# Patient Record
Sex: Female | Born: 1985 | Hispanic: Yes | Marital: Married | Smoking: Never smoker
Health system: Southern US, Community
[De-identification: ages and names within clinical notes are randomized; demographics above are authoritative.]

---

## 2020-02-12 ENCOUNTER — Emergency Department: Payer: Self-pay

## 2020-02-12 ENCOUNTER — Other Ambulatory Visit: Payer: Self-pay

## 2020-02-12 ENCOUNTER — Emergency Department
Admission: EM | Admit: 2020-02-12 | Discharge: 2020-02-12 | Disposition: A | Discharge status: Home / Self Care | Payer: Self-pay | Attending: Emergency Medicine | Admitting: Emergency Medicine

## 2020-02-12 DIAGNOSIS — R109 Unspecified abdominal pain: Secondary | ICD-10-CM | POA: Insufficient documentation

## 2020-02-12 DIAGNOSIS — M25552 Pain in left hip: Secondary | ICD-10-CM | POA: Insufficient documentation

## 2020-02-12 DIAGNOSIS — S40212A Abrasion of left shoulder, initial encounter: Secondary | ICD-10-CM | POA: Insufficient documentation

## 2020-02-12 DIAGNOSIS — Y92410 Unspecified street and highway as the place of occurrence of the external cause: Secondary | ICD-10-CM | POA: Insufficient documentation

## 2020-02-12 DIAGNOSIS — M545 Low back pain, unspecified: Secondary | ICD-10-CM | POA: Insufficient documentation

## 2020-02-12 DIAGNOSIS — S060X1A Concussion with loss of consciousness of 30 minutes or less, initial encounter: Secondary | ICD-10-CM | POA: Insufficient documentation

## 2020-02-12 LAB — URINALYSIS, COMPLETE (UACMP) WITH MICROSCOPIC
Bilirubin Urine: NEGATIVE
Glucose, UA: NEGATIVE mg/dL
Ketones, ur: NEGATIVE mg/dL
Leukocytes,Ua: NEGATIVE
Nitrite: NEGATIVE
Protein, ur: NEGATIVE mg/dL
Specific Gravity, Urine: 1.005 (ref 1.005–1.030)
pH: 5 (ref 5.0–8.0)

## 2020-02-12 LAB — CBC
HCT: 36.6 % (ref 36.0–46.0)
Hemoglobin: 12.1 g/dL (ref 12.0–15.0)
MCH: 30.3 pg (ref 26.0–34.0)
MCHC: 33.1 g/dL (ref 30.0–36.0)
MCV: 91.5 fL (ref 80.0–100.0)
Platelets: 257 10*3/uL (ref 150–400)
RBC: 4 MIL/uL (ref 3.87–5.11)
RDW: 12.9 % (ref 11.5–15.5)
WBC: 7.9 10*3/uL (ref 4.0–10.5)
nRBC: 0 % (ref 0.0–0.2)

## 2020-02-12 LAB — POC URINE PREG, ED: Preg Test, Ur: NEGATIVE

## 2020-02-12 LAB — BASIC METABOLIC PANEL
Anion gap: 8 (ref 5–15)
BUN: 9 mg/dL (ref 6–20)
CO2: 27 mmol/L (ref 22–32)
Calcium: 9 mg/dL (ref 8.9–10.3)
Chloride: 105 mmol/L (ref 98–111)
Creatinine, Ser: 0.59 mg/dL (ref 0.44–1.00)
GFR, Estimated: >60 mL/min (ref >60)
Glucose, Bld: 104 mg/dL — ABNORMAL HIGH (ref 70–99)
Potassium: 4 mmol/L (ref 3.5–5.1)
Sodium: 140 mmol/L (ref 135–145)

## 2020-02-12 LAB — PREGNANCY, URINE: Preg Test, Ur: NEGATIVE

## 2020-02-12 MED ORDER — IBUPROFEN 600 MG PO TABS: 600.0000 mg | ORAL_TABLET | Freq: Four times a day (QID) | ORAL | 0 refills | Status: DC | PRN

## 2020-02-12 MED ORDER — IOHEXOL 300 MG/ML  SOLN
100.0000 mL | Freq: Once | INTRAMUSCULAR | Status: AC | PRN
  Administered 2020-02-12: 100 mL via INTRAVENOUS
  Filled 2020-02-12: qty 100

## 2020-02-12 MED ORDER — CYCLOBENZAPRINE HCL 5 MG PO TABS: ORAL_TABLET | ORAL | 0 refills | Status: DC
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
## 2020-02-12 NOTE — ED Notes (Addendum)
Pt reports slight pain with ambulation to left hip/leg. Steady on feet  interpreter used for triage:   Benny: [id number]

## 2020-02-12 NOTE — ED Notes (Signed)
Pt calm , collective, and ambulatory upon discharge

## 2020-07-18 ENCOUNTER — Ambulatory Visit (LOCAL_COMMUNITY_HEALTH_CENTER): Payer: Self-pay | Admitting: Family Medicine

## 2020-07-18 ENCOUNTER — Other Ambulatory Visit: Payer: Self-pay

## 2020-07-18 ENCOUNTER — Encounter: Payer: Self-pay | Admitting: Family Medicine

## 2020-07-18 DIAGNOSIS — Z3009 Encounter for other general counseling and advice on contraception: Secondary | ICD-10-CM

## 2020-07-18 DIAGNOSIS — Z0000 Encounter for general adult medical examination without abnormal findings: Secondary | ICD-10-CM

## 2020-07-18 DIAGNOSIS — Z113 Encounter for screening for infections with a predominantly sexual mode of transmission: Secondary | ICD-10-CM

## 2020-07-18 LAB — WET PREP FOR TRICH, YEAST, CLUE
Trichomonas Exam: NEGATIVE
Yeast Exam: NEGATIVE

## 2020-07-18 LAB — HM HIV SCREENING LAB: HM HIV Screening: NEGATIVE

## 2020-07-18 NOTE — Progress Notes (Signed)
Pt here for PE and possible IUD removal. Wet mount results reviewed with Provider.  No treatment required.

## 2020-07-18 NOTE — Progress Notes (Incomplete)
Family Planning Visit- Repeat Yearly Visit  Subjective:  Andrea Russo is a 35 y.o. [phone]  being seen today for an annual wellness visit and to discuss contraception options.   The patient is currently using IUD Mirena for pregnancy prevention. Patient does not want a pregnancy in the next year. Patient has the following medical problems: does not have a problem list on file.  No chief complaint on file.   Patient reports here for physical   Patient denies any problems or concerns.    See flowsheet for other program required questions.   There is no height or weight on file to calculate BMI. - Patient is eligible for diabetes screening based on BMI and age >63?  not applicable HA1C ordered? not applicable  Patient reports 1 of partners in last year. Desires STI screening?  Yes   Has patient been screened once for HCV in the past?  No  No results found for: HCVAB  Does the patient have current of drug use, have a partner with drug use, and/or has been incarcerated since last result? No  If yes-- Screen for HCV through [facility]   Does the patient meet criteria for HBV testing? No  Criteria:  -Household, sexual or needle sharing contact with HBV -History of drug use -HIV positive -Those with known Hep C   Health Maintenance Due  Topic Date Due  . HIV Screening  Never done  . Hepatitis C Screening  Never done  . TETANUS/TDAP  Never done  . PAP SMEAR-Modifier  Never done    Review of Systems  Constitutional: Negative for chills, fever, malaise/fatigue and weight loss.  HENT: Negative for congestion, hearing loss and sore throat.   Eyes: Negative for blurred vision, double vision and photophobia.  Respiratory: Negative for shortness of breath.   Cardiovascular: Negative for chest pain.  Gastrointestinal: Negative for abdominal pain, blood in stool, constipation, diarrhea, heartburn, nausea and vomiting.  Genitourinary: Negative for dysuria and frequency.   Musculoskeletal: Negative for back pain, joint pain and neck pain.  Skin: Negative for itching and rash.  Neurological: Negative for dizziness, weakness and headaches.  Endo/Heme/Allergies: Does not bruise/bleed easily.  Psychiatric/Behavioral: Negative for depression, substance abuse and suicidal ideas.    The following portions of the patient's history were reviewed and updated as appropriate: allergies, current medications, past family history, past medical history, past social history, past surgical history and problem list. Problem list updated.  Objective:  There were no vitals filed for this visit.  Physical Exam Vitals and nursing note reviewed.  Constitutional:      Appearance: Normal appearance.  HENT:     Head: Normocephalic and atraumatic.     Mouth/Throat:     Mouth: Mucous membranes are moist.     Dentition: Normal dentition.     Pharynx: No oropharyngeal exudate or posterior oropharyngeal erythema.  Eyes:     General: No scleral icterus. Neck:     Thyroid: No thyroid mass, thyromegaly or thyroid tenderness.  Cardiovascular:     Rate and Rhythm: Normal rate.     Pulses: Normal pulses.  Pulmonary:     Effort: Pulmonary effort is normal.  Chest:     Comments: Breasts:        Right: Normal. No swelling, mass, nipple discharge, skin change or tenderness.        Left: Normal. No swelling, mass, nipple discharge, skin change or tenderness.  Abdominal:     General: Abdomen is flat. Bowel  sounds are normal.     Palpations: Abdomen is soft.  Musculoskeletal:        General: Normal range of motion.  Skin:    General: Skin is warm and dry.  Neurological:     General: No focal deficit present.     Mental Status: She is alert.       Assessment and Plan:  Andrea Russo is a 35 y.o. female [phone] presenting to the [facility] Department for an yearly wellness and contraception visit   2. Screening examination for venereal disease  -  Chlamydia/Gonorrhea [facility] - Syphilis Serology, [facility] - HIV [place] [facility] - WET PREP FOR TRICH, YEAST, CLUE  2. Routine general medical examination at a health care facility  Woman exam exam today  Pap due 2024     3. Family planning counseling  Contraception counseling: Reviewed all forms of birth control options in the tiered based approach. available including abstinence; over the counter/barrier methods; hormonal contraceptive medication including pill, patch, ring, injection,contraceptive implant, ECP; hormonal and nonhormonal IUDs; permanent sterilization options including vasectomy and the various tubal sterilization modalities. Risks, benefits, and typical effectiveness rates were reviewed.  Questions were answered.  Written information was also given to the patient to review.  Patient Andrea Russo does not expire until 02/2021.  Patient to RTC 02/2021 to remove and replace BCM.    Patient was not offered ECP based on current IUD.    No follow-ups on file.  No future appointments.  Wendi Snipes, FNP

## 2020-07-21 NOTE — Progress Notes (Signed)
Family Planning Visit- Repeat Yearly Visit  Subjective:  Andrea Russo is a 35 y.o. [phone]  being seen today for an annual wellness visit and to discuss contraception options.   The patient is currently using IUD Mirena for pregnancy prevention. Patient does not want a pregnancy in the next year. Patient has the following medical problems: does not have a problem list on file.  No chief complaint on file.   Patient reports here for physical   Patient denies any problems or concerns.    See flowsheet for other program required questions.   There is no height or weight on file to calculate BMI. - Patient is eligible for diabetes screening based on BMI and age >63?  not applicable HA1C ordered? not applicable  Patient reports 1 of partners in last year. Desires STI screening?  Yes   Has patient been screened once for HCV in the past?  No  No results found for: HCVAB  Does the patient have current of drug use, have a partner with drug use, and/or has been incarcerated since last result? No  If yes-- Screen for HCV through [facility]   Does the patient meet criteria for HBV testing? No  Criteria:  -Household, sexual or needle sharing contact with HBV -History of drug use -HIV positive -Those with known Hep C   Health Maintenance Due  Topic Date Due  . HIV Screening  Never done  . Hepatitis C Screening  Never done  . TETANUS/TDAP  Never done  . PAP SMEAR-Modifier  Never done    Review of Systems  Constitutional: Negative for chills, fever, malaise/fatigue and weight loss.  HENT: Negative for congestion, hearing loss and sore throat.   Eyes: Negative for blurred vision, double vision and photophobia.  Respiratory: Negative for shortness of breath.   Cardiovascular: Negative for chest pain.  Gastrointestinal: Negative for abdominal pain, blood in stool, constipation, diarrhea, heartburn, nausea and vomiting.  Genitourinary: Negative for dysuria and frequency.   Musculoskeletal: Negative for back pain, joint pain and neck pain.  Skin: Negative for itching and rash.  Neurological: Negative for dizziness, weakness and headaches.  Endo/Heme/Allergies: Does not bruise/bleed easily.  Psychiatric/Behavioral: Negative for depression, substance abuse and suicidal ideas.    The following portions of the patient's history were reviewed and updated as appropriate: allergies, current medications, past family history, past medical history, past social history, past surgical history and problem list. Problem list updated.  Objective:  There were no vitals filed for this visit.  Physical Exam Vitals and nursing note reviewed.  Constitutional:      Appearance: Normal appearance.  HENT:     Head: Normocephalic and atraumatic.     Mouth/Throat:     Mouth: Mucous membranes are moist.     Dentition: Normal dentition.     Pharynx: No oropharyngeal exudate or posterior oropharyngeal erythema.  Eyes:     General: No scleral icterus. Neck:     Thyroid: No thyroid mass, thyromegaly or thyroid tenderness.  Cardiovascular:     Rate and Rhythm: Normal rate.     Pulses: Normal pulses.  Pulmonary:     Effort: Pulmonary effort is normal.  Chest:     Comments: Breasts:        Right: Normal. No swelling, mass, nipple discharge, skin change or tenderness.        Left: Normal. No swelling, mass, nipple discharge, skin change or tenderness.  Abdominal:     General: Abdomen is flat. Bowel  sounds are normal.     Palpations: Abdomen is soft.  Musculoskeletal:        General: Normal range of motion.  Skin:    General: Skin is warm and dry.  Neurological:     General: No focal deficit present.     Mental Status: She is alert.       Assessment and Plan:  Andrea Russo is a 35 y.o. female [phone] presenting to the [facility] Department for an yearly wellness and contraception visit   1. Routine general medical examination at a health care  facilit Woman exam exam today  Pap due 2024 Clinical breast exam   2. Family planning counseling  Contraception counseling: Reviewed all forms of birth control options in the tiered based approach. available including abstinence; over the counter/barrier methods; hormonal contraceptive medication including pill, patch, ring, injection,contraceptive implant, ECP; hormonal and nonhormonal IUDs; permanent sterilization options including vasectomy and the various tubal sterilization modalities. Risks, benefits, and typical effectiveness rates were reviewed.  Questions were answered.  Written information was also given to the patient to review.  [name] does not expire until 02/2021.  Patient to RTC 02/2021 to remove and replace BCM.    Patient was not offered ECP based on current IUD.    3. Screening examination for venereal disease  - Chlamydia/Gonorrhea [place] [facility] - Syphilis Serology, [place] [facility] - HIV  LAB - WET PREP FOR TRICH, YEAST, CLUE  Patient accepted all screenings including wet prep,vaginal CT/GC and bloodwork for HIV/RPR.   Discussed time line for [facility] results and that patient will be called with positive results and encouraged patient to call if she had not heard in 2 weeks.  Counseled to return or seek care for continued or worsening symptoms Recommended condom use with all sex     V. Olmedo used for [place] interpretation.     No follow-ups on file.  No future appointments.  Wendi Snipes, FNP

## 2020-07-29 ENCOUNTER — Telehealth: Payer: Self-pay

## 2020-07-29 NOTE — Telephone Encounter (Signed)
PC to pt [facility] . 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 Pt verbalized understanding.

## 2021-03-18 ENCOUNTER — Encounter: Payer: Self-pay | Admitting: Advanced Practice Midwife

## 2021-03-18 ENCOUNTER — Ambulatory Visit (LOCAL_COMMUNITY_HEALTH_CENTER): Payer: Self-pay | Admitting: Advanced Practice Midwife

## 2021-03-18 ENCOUNTER — Other Ambulatory Visit: Payer: Self-pay

## 2021-03-18 VITALS — BP 99/58 | HR 74 | Temp 98.8°F | Resp 16 | Ht 64.0 in | Wt 155.0 lb

## 2021-03-18 DIAGNOSIS — E663 Overweight: Secondary | ICD-10-CM

## 2021-03-18 DIAGNOSIS — Z30018 Encounter for initial prescription of other contraceptives: Secondary | ICD-10-CM

## 2021-03-18 DIAGNOSIS — Z3009 Encounter for other general counseling and advice on contraception: Secondary | ICD-10-CM

## 2021-03-18 LAB — WET PREP FOR TRICH, YEAST, CLUE
Trichomonas Exam: NEGATIVE
Yeast Exam: NEGATIVE

## 2021-03-18 MED ORDER — LEVONORGESTREL 20 MCG/DAY IU IUD
1.0000 | INTRAUTERINE_SYSTEM | Freq: Once | INTRAUTERINE | Status: AC
  Administered 2021-03-18: 1 via INTRAUTERINE

## 2021-03-18 NOTE — Progress Notes (Addendum)
IUD placed during this visit. IUD consent and Mirena consent signed. Mirena card given to patient. Education given. All questions answered.   Wet mount reviewed during clinic visit - no treatment indicated.   Floy Sabina, RN

## 2021-03-18 NOTE — Progress Notes (Signed)
Visit Reason IUD Removal and IUD reinsertion   Last PE 07/18/20 at [facility]  Last PAP 03/23/17 (NILM and HPV negative)  BCM Mirena placed in 02/16/2014 (no issues)   LMP Patient states around 08/2020 (irregular due to Mirena)   Last Sex 03/14/21  Last STD Screen  07/18/20 (negative)  Desires testing today?             No                Last HIV test 07/18/20 (negative)  Desires testing today?              No

## 2021-03-18 NOTE — Progress Notes (Signed)
[facility] [address] Main Number: [phone]  Contraception/Family Planning VISIT ENCOUNTER NOTE  Subjective:   Andrea Russo is a 36 y.o. [employer] nonsmoker [id number] (17, 23, 7) female here for reproductive life counseling. The patient is currently using IUD or IUS to prevent pregnancy.  Desires Mirena removal/reinsertion for [employer].  The patient does not want a pregnancy in the next year.   Last PE 07/18/20. Last pap 03/23/17 neg HPV neg. Last sex 03/14/21 without condom; with current partner x 20 years.  Mirena inserted 02/16/2014.  LMP 6 mo ago "1 day of bleeding"  Denies abnormal vaginal bleeding, discharge, pelvic pain, problems with intercourse or other gynecologic concerns.    Gynecologic History No LMP recorded. (Menstrual status: IUD).  Health Maintenance Due  Topic Date Due   Hepatitis C Screening  Never done   TETANUS/TDAP  Never done   PAP SMEAR-Modifier  Never done   INFLUENZA VACCINE  Never done     The following portions of the patient's history were reviewed and updated as appropriate: allergies, current medications, past family history, past medical history, past social history, past surgical history and problem list.  Review of Systems Pertinent items are noted in HPI.   Objective:  There were no vitals taken for this visit. Gen: well appearing, NAD HEENT: no scleral icterus CV: RR Lung: Normal WOB Ext: warm well perfused  PELVIC: Normal appearing external genitalia; normal appearing vaginal mucosa and cervix.  No abnormal discharge noted.   Normal uterine size. Pt c/o external burning. GC/Chlamydia/wet mount done.    Assessment and Plan:   Contraception counseling: Reviewed all forms of birth control options in the tiered based approach. available including abstinence; over the counter/barrier methods; hormonal contraceptive medication including pill, patch, ring, injection,contraceptive  implant, ECP; hormonal and nonhormonal IUDs; permanent sterilization options including vasectomy and the various tubal sterilization modalities. Risks, benefits, and typical effectiveness rates were reviewed.  Questions were answered.  Written information was also given to the patient to review.  Patient desires Mirena removal/reinsertion, this was prescribed for patient. She will follow up in  1 year for surveillance.  She was told to call with any further questions, or with any concerns about this method of contraception.  Emphasized use of condoms 100% of the time for STI prevention.  Patient was not offered ECP due to not meeting criteria. ECP was not accepted by the patient. ECP counseling was not given - see RN documentation  1. Family planning GC/Chlamydia and wet mount done for c/o external burning - WET PREP FOR TRICH, YEAST, CLUE - Chlamydia/Gonorrhea  Lab  2. Encounter for initial prescription of other contraceptives   IUD Removal  Patient identified, informed consent performed, consent signed.  Patient was in the dorsal lithotomy position, normal external genitalia was noted.  A speculum was placed in the patient's vagina, normal discharge was noted, no lesions. The cervix was visualized, no lesions, no abnormal discharge.  The strings of the IUD were grasped and pulled using ring forceps. The IUD was removed in its entirety and shown to pt.  Patient tolerated the procedure well.      Patient presented to [facility] for IUD insertion. Her GC/CT screening was found to be up to date and using WHO criteria we can be reasonably certain she is not pregnant or a pregnancy test was obtained which was Urine pregnancy test  today was N/A.  See Flowsheet for IUD check list  IUD Insertion Procedure Note Patient identified, informed consent performed, consent signed.   Discussed risks of irregular bleeding, cramping, infection, malpositioning or misplacement of the IUD outside the uterus which  may require further procedure such as laparoscopy. Time out was performed.    Speculum placed in the vagina.  Cervix visualized.  Cleaned with Betadine x 2.  Cervix Grasped anteriorly with a single tooth tenaculum.  Uterus sounded to 8 cm.  IUD placed per manufacturer's recommendations.  Strings trimmed to 3 cm. Tenaculum was removed, good hemostasis noted.  Patient tolerated procedure well.   Patient was given post-procedure instructions- both agency handout and verbally by provider.  She was advised to have backup contraception for one week.  Patient was also asked to check IUD strings periodically or follow up in 4 weeks for IUD check.   Patient will use abstinance for contraception/plans for pregnancy soon and she was told to avoid teratogens, take PNV and folic acid.  Routine preventative health maintenance measures emphasized.     Please refer to After Visit Summary for other counseling recommendations.   Return in about 1 year (around 03/18/2022) for yearly physical exam.  Alberteen Spindle, CNM [facility] DEPARTMENT
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
